# Patient Record
Sex: Male | Born: 1975 | Race: White | Hispanic: No | Marital: Married | Smoking: Never smoker
Health system: Southern US, Community
[De-identification: ages and names within clinical notes are randomized; demographics above are authoritative.]

## PROBLEM LIST (undated history)

## (undated) DIAGNOSIS — G43909 Migraine, unspecified, not intractable, without status migrainosus: Secondary | ICD-10-CM

## (undated) DIAGNOSIS — I10 Essential (primary) hypertension: Secondary | ICD-10-CM

## (undated) DIAGNOSIS — E039 Hypothyroidism, unspecified: Secondary | ICD-10-CM

## (undated) HISTORY — PX: NO PAST SURGERIES: SHX2092

---

## 2016-11-30 ENCOUNTER — Ambulatory Visit
Admission: EM | Admit: 2016-11-30 | Discharge: 2016-11-30 | Disposition: A | Discharge status: Home / Self Care | Payer: BLUE CROSS/BLUE SHIELD | Attending: Family Medicine | Admitting: Family Medicine

## 2016-11-30 ENCOUNTER — Encounter: Payer: Self-pay | Admitting: Emergency Medicine

## 2016-11-30 ENCOUNTER — Other Ambulatory Visit: Payer: Self-pay

## 2016-11-30 DIAGNOSIS — G43009 Migraine without aura, not intractable, without status migrainosus: Secondary | ICD-10-CM

## 2016-11-30 MED ORDER — HYDROCODONE-ACETAMINOPHEN 5-325 MG PO TABS: 1.0000 | ORAL_TABLET | Freq: Two times a day (BID) | ORAL | 0 refills | Status: DC | PRN

## 2016-11-30 MED ORDER — KETOROLAC TROMETHAMINE 60 MG/2ML IM SOLN
60.0000 mg | Freq: Once | INTRAMUSCULAR | Status: AC
  Administered 2016-11-30: 60 mg via INTRAMUSCULAR

## 2016-11-30 NOTE — ED Provider Notes (Signed)
[facility]    CSN: [id number] Arrival date & time: 11/30/16  1409     History   Chief Complaint Chief Complaint  Patient presents with  . Headache    HPI Roger KillJeffrey Eutsler is a 41 y.o. male.   41 yo male with a c/o headache for 5 days, mainly on the right side associated with photophobia. Denies any nausea, vomiting, vision changes, fevers, chills, numbness/tingling. States has had migraine headaches in the past.    The history is provided by the patient.  Headache    History reviewed. No pertinent past medical history.  There are no active problems to display for this patient.   History reviewed. No pertinent surgical history.     Home Medications    Prior to Admission medications   Medication Sig Start Date End Date Taking? Authorizing Provider  aspirin EC 81 MG tablet Take 81 mg daily by mouth.   Yes [provider]  HYDROcodone-acetaminophen (NORCO/VICODIN) 5-325 MG tablet Take 1-2 tablets 2 (two) times daily as needed by mouth. 11/30/16   Payton Mccallumonty, Leina Babe, MD    Family History Family History  Problem Relation Age of Onset  . Psoriasis Father [id number]       alcoholic psoriasis of liver  . Coronary artery disease Father     Social History Social History   Tobacco Use  . Smoking status: Never Smoker  . Smokeless tobacco: Never Used  Substance Use Topics  . Alcohol use: Yes    Alcohol/week: 4.8 oz    Types: 8 Cans of beer per week  . Drug use: No     Allergies   Patient has no known allergies.   Review of Systems Review of Systems  Neurological: Positive for headaches.     Physical Exam Triage Vital Signs ED Triage Vitals [11/30/16 1426]  Enc Vitals Group     BP (!) 127/98     Pulse Rate 100     Resp 16     Temp 98.6 F (37 C)     Temp Source Oral     SpO2 97 %     Weight 200 lb (90.7 kg)     Height 5\' 7"  (1.702 m)     Head Circumference      Peak Flow      Pain Score 6     Pain Loc      Pain Edu?      Excl. in  GC?    No data found.  Updated Vital Signs BP (!) 127/98 (BP Location: Left Arm)   Pulse 100   Temp 98.6 F (37 C) (Oral)   Resp 16   Ht 5\' 7"  (1.702 m)   Wt 200 lb (90.7 kg)   SpO2 97%   BMI 31.32 kg/m   Visual Acuity Right Eye Distance:   Left Eye Distance:   Bilateral Distance:    Right Eye Near:   Left Eye Near:    Bilateral Near:     Physical Exam  Constitutional: He is oriented to person, place, and time. He appears well-developed and well-nourished. No distress.  HENT:  Head: Normocephalic and atraumatic.  Right Ear: Tympanic membrane, external ear and ear canal normal.  Left Ear: Tympanic membrane, external ear and ear canal normal.  Nose: Nose normal.  Mouth/Throat: Uvula is midline, oropharynx is clear and moist and mucous membranes are normal. No oropharyngeal exudate or tonsillar abscesses.  Eyes: Conjunctivae and EOM are normal. Pupils are equal, round, and reactive  to light. Right eye exhibits no discharge. Left eye exhibits no discharge. No scleral icterus.  Neck: Normal range of motion. Neck supple. No tracheal deviation present. No thyromegaly present.  Cardiovascular: Normal rate, regular rhythm and normal heart sounds.  Pulmonary/Chest: Effort normal and breath sounds normal. No stridor. No respiratory distress. He has no wheezes. He has no rales. He exhibits no tenderness.  Lymphadenopathy:    He has no cervical adenopathy.  Neurological: He is alert and oriented to person, place, and time. He displays normal reflexes. No cranial nerve deficit or sensory deficit. He exhibits normal muscle tone. Coordination normal.  Skin: Skin is warm and dry. No rash noted. He is not diaphoretic.  Nursing note and vitals reviewed.    UC Treatments / Results  Labs (all labs ordered are listed, but only abnormal results are displayed) Labs Reviewed - No data to display  EKG  EKG Interpretation None       Radiology No results found.  Procedures Procedures  (including critical care time)  Medications Ordered in UC Medications  ketorolac (TORADOL) injection 60 mg (60 mg Intramuscular Given 11/30/16 1457)     Initial Impression / Assessment and Plan / UC Course  I have reviewed the triage vital signs and the nursing notes.  Pertinent labs & imaging results that were available during my care of the patient were reviewed by me and considered in my medical decision making (see chart for details).       Final Clinical Impressions(s) / UC Diagnoses   Final diagnoses:  Migraine without aura and without status migrainosus, not intractable    ED Discharge Orders        Ordered    HYDROcodone-acetaminophen (NORCO/VICODIN) 5-325 MG tablet  2 times daily PRN     11/30/16 1508    1. diagnosis reviewed with patient 2. rx as per orders above; reviewed possible side effects, interactions, risks and benefits  3. Given toradol 60mg  im x 1 with improvement 4. Follow up with PCP for recheck blood pressure 5. Follow-up prn if symptoms worsen or don't improve  Controlled Substance Prescriptions [place] Controlled Substance Registry consulted? Not Applicable   Payton Mccallumonty, Hazaiah Edgecombe, MD 11/30/16 2054

## 2016-11-30 NOTE — ED Triage Notes (Signed)
Patient in today c/o headache x 5 days. Tylenol is not helping. Patient states his BP has been elevated. Patient is not on BP meds.

## 2017-02-04 DIAGNOSIS — R519 Headache, unspecified: Secondary | ICD-10-CM | POA: Insufficient documentation

## 2017-02-04 DIAGNOSIS — E669 Obesity, unspecified: Secondary | ICD-10-CM | POA: Insufficient documentation

## 2017-02-04 DIAGNOSIS — I10 Essential (primary) hypertension: Secondary | ICD-10-CM | POA: Insufficient documentation

## 2019-03-10 DIAGNOSIS — E781 Pure hyperglyceridemia: Secondary | ICD-10-CM | POA: Insufficient documentation

## 2019-11-28 ENCOUNTER — Ambulatory Visit (INDEPENDENT_AMBULATORY_CARE_PROVIDER_SITE_OTHER): Payer: BC Managed Care – PPO

## 2019-11-28 ENCOUNTER — Encounter: Payer: Self-pay | Admitting: Emergency Medicine

## 2019-11-28 ENCOUNTER — Other Ambulatory Visit: Payer: Self-pay

## 2019-11-28 ENCOUNTER — Ambulatory Visit
Admission: EM | Admit: 2019-11-28 | Discharge: 2019-11-28 | Disposition: A | Discharge status: Home / Self Care | Payer: BC Managed Care – PPO | Attending: Internal Medicine | Admitting: Internal Medicine

## 2019-11-28 DIAGNOSIS — Z79899 Other long term (current) drug therapy: Secondary | ICD-10-CM | POA: Diagnosis not present

## 2019-11-28 DIAGNOSIS — Z7982 Long term (current) use of aspirin: Secondary | ICD-10-CM | POA: Diagnosis not present

## 2019-11-28 DIAGNOSIS — R0602 Shortness of breath: Secondary | ICD-10-CM | POA: Diagnosis not present

## 2019-11-28 DIAGNOSIS — J0120 Acute ethmoidal sinusitis, unspecified: Secondary | ICD-10-CM | POA: Diagnosis not present

## 2019-11-28 DIAGNOSIS — R509 Fever, unspecified: Secondary | ICD-10-CM

## 2019-11-28 DIAGNOSIS — R197 Diarrhea, unspecified: Secondary | ICD-10-CM | POA: Diagnosis not present

## 2019-11-28 DIAGNOSIS — G43801 Other migraine, not intractable, with status migrainosus: Secondary | ICD-10-CM | POA: Insufficient documentation

## 2019-11-28 DIAGNOSIS — M791 Myalgia, unspecified site: Secondary | ICD-10-CM | POA: Diagnosis present

## 2019-11-28 DIAGNOSIS — Z20822 Contact with and (suspected) exposure to covid-19: Secondary | ICD-10-CM | POA: Diagnosis not present

## 2019-11-28 HISTORY — DX: Migraine, unspecified, not intractable, without status migrainosus: G43.909

## 2019-11-28 HISTORY — DX: Essential (primary) hypertension: I10

## 2019-11-28 LAB — RAPID INFLUENZA A&B ANTIGENS
Influenza A (ARMC): NEGATIVE
Influenza B (ARMC): NEGATIVE

## 2019-11-28 LAB — CBC WITH DIFFERENTIAL/PLATELET
Abs Immature Granulocytes: 0.09 10*3/uL — ABNORMAL HIGH (ref 0.00–0.07)
Basophils Absolute: 0.1 10*3/uL (ref 0.0–0.1)
Basophils Relative: 0 %
Eosinophils Absolute: 0 10*3/uL (ref 0.0–0.5)
Eosinophils Relative: 0 %
HCT: 47.6 % (ref 39.0–52.0)
Hemoglobin: 16.8 g/dL (ref 13.0–17.0)
Immature Granulocytes: 1 %
Lymphocytes Relative: 6 %
Lymphs Abs: 0.8 10*3/uL (ref 0.7–4.0)
MCH: 30.5 pg (ref 26.0–34.0)
MCHC: 35.3 g/dL (ref 30.0–36.0)
MCV: 86.5 fL (ref 80.0–100.0)
Monocytes Absolute: 0.9 10*3/uL (ref 0.1–1.0)
Monocytes Relative: 7 %
Neutro Abs: 12.1 10*3/uL — ABNORMAL HIGH (ref 1.7–7.7)
Neutrophils Relative %: 86 %
Platelets: 182 10*3/uL (ref 150–400)
RBC: 5.5 MIL/uL (ref 4.22–5.81)
RDW: 11.9 % (ref 11.5–15.5)
WBC: 13.9 10*3/uL — ABNORMAL HIGH (ref 4.0–10.5)
nRBC: 0 % (ref 0.0–0.2)

## 2019-11-28 LAB — SARS CORONAVIRUS 2 (TAT 6-24 HRS): SARS Coronavirus 2: NEGATIVE

## 2019-11-28 LAB — SARS CORONAVIRUS 2 AG (30 MIN TAT): SARS Coronavirus 2 Ag: NEGATIVE

## 2019-11-28 MED ORDER — CEFDINIR 300 MG PO CAPS: 300.0000 mg | ORAL_CAPSULE | Freq: Two times a day (BID) | ORAL | 0 refills | Status: DC

## 2019-11-28 MED ORDER — FLUTICASONE PROPIONATE 50 MCG/ACT NA SUSP: 1.0000 | Freq: Every day | NASAL | 0 refills | Status: AC

## 2019-11-28 MED ORDER — KETOROLAC TROMETHAMINE 60 MG/2ML IM SOLN
60.0000 mg | Freq: Once | INTRAMUSCULAR | Status: AC
  Administered 2019-11-28: 60 mg via INTRAMUSCULAR

## 2019-11-28 NOTE — ED Provider Notes (Signed)
[facility]    CSN: [id number] Arrival date & time: 11/28/19  8921      History   Chief Complaint Chief Complaint  Patient presents with  . Generalized Body Aches    HPI Lola Lofaro is a 44 y.o. male who presents with onset of body aches, nose congestion, SOB, HA, and subjective fever x 2 days. Has also been having diarrhea 5 BM's per day x 2 days and night sweats. Denies being out of the country or an antibiotics in the past month. Denies a cough, just clearing his throat from the green post nasal drainage. Denies being sick at all in the past 3 weeks prior to this sickness. Has hx of migraine HA's and he feels he has one right now. Has taken Tylenol 2 nights ago and did not help the aches or HA, and took Nyquil last night and again did not help. He feels he is having acute migraine since the HA is right under his L orbit and has photophobia. Pain level 8/10.   Past Medical History:  Diagnosis Date  . Hypertension   . Migraine     There are no problems to display for this patient.   Past Surgical History:  Procedure Laterality Date  . NO PAST SURGERIES       Home Medications    Prior to Admission medications   Medication Sig Start Date End Date Taking? Authorizing Provider  aspirin EC 81 MG tablet Take 81 mg daily by mouth.   Yes [provider]  lisinopril (ZESTRIL) 10 MG tablet Take 10 mg by mouth daily.   Yes [provider]  SUMAtriptan (IMITREX) 50 MG tablet Take 50 mg by mouth every 2 (two) hours as needed for migraine. May repeat in 2 hours if headache persists or recurs.   Yes [provider]  cefdinir (OMNICEF) 300 MG capsule Take 1 capsule (300 mg total) by mouth 2 (two) times daily. 11/28/19   Rodriguez-Southworth, Nettie Elm, PA-C  fluticasone (FLONASE) 50 MCG/ACT nasal spray Place 1 spray into both nostrils daily. For stuffy nose 11/28/19   Rodriguez-Southworth, Nettie Elm, PA-C    Family History Family History  Problem  Relation Age of Onset  . Psoriasis Father 71       alcoholic psoriasis of liver  . Coronary artery disease Father     Social History Social History   Tobacco Use  . Smoking status: Never Smoker  . Smokeless tobacco: Never Used  Vaping Use  . Vaping Use: Never used  Substance Use Topics  . Alcohol use: Yes    Alcohol/week: 8.0 standard drinks    Types: 8 Cans of beer per week  . Drug use: No     Allergies   Patient has no known allergies.   Review of Systems Review of Systems  Constitutional: Positive for activity change, appetite change, chills, diaphoresis, fatigue and fever.  HENT: Positive for congestion, postnasal drip, rhinorrhea and sinus pressure. Negative for ear discharge, ear pain, sinus pain, sore throat and trouble swallowing.   Eyes: Positive for photophobia. Negative for discharge.  Respiratory: Positive for shortness of breath. Negative for cough and wheezing.   Cardiovascular: Negative for chest pain.  Gastrointestinal: Negative for abdominal pain, diarrhea, nausea and vomiting.  Genitourinary: Negative for difficulty urinating, dysuria and frequency.  Musculoskeletal: Positive for myalgias. Negative for gait problem.  Skin: Negative for rash.  Neurological: Positive for headaches. Negative for dizziness and weakness.   Physical Exam Triage Vital Signs ED Triage  Vitals  Enc Vitals Group     BP 11/28/19 0906 (!) 149/105     Pulse Rate 11/28/19 0906 (!) 122     Resp 11/28/19 0906 18     Temp 11/28/19 0906 (!) 100.6 F (38.1 C)     Temp Source 11/28/19 0906 Oral     SpO2 11/28/19 0906 96 %     Weight 11/28/19 0903 199 lb 15.3 oz (90.7 kg)     Height 11/28/19 0903 5\' 7"  (1.702 m)     Head Circumference --      Peak Flow --      Pain Score 11/28/19 0903 8     Pain Loc --      Pain Edu? --      Excl. in GC? --    No data found.  Updated Vital Signs BP (!) 134/109   Pulse (!) 110   Temp 99.4 F (37.4 C) (Oral)   Resp 18   Ht 5\' 7"  (1.702 m)    Wt 199 lb 15.3 oz (90.7 kg)   SpO2 96%   BMI 31.32 kg/m   Visual Acuity Right Eye Distance:   Left Eye Distance:   Bilateral Distance:    Right Eye Near:   Left Eye Near:    Bilateral Near:     Physical Exam Vitals signs and nursing note reviewed.  Constitutional:      General: She is not in acute distress.    Appearance: Normal appearance. She is not ill-appearing, toxic-appearing or diaphoretic.  HENT:     Head: Normocephalic.     Right Ear: Tympanic membrane, ear canal and external ear normal.     Left Ear: Tympanic membrane, ear canal and external ear normal.     Nose: with mild mucosa congestion. His ethmoid sinuses are tender L>R    Mouth/Throat: clear    Mouth: Mucous membranes are moist.  Eyes:     General: No scleral icterus.       Right eye: No discharge.        Left eye: No discharge.     Conjunctiva/sclera: Conjunctivae normal.  Neck:     Musculoskeletal: Neck supple. No neck rigidity.  Cardiovascular:     Rate and Rhythm: Normal rate and regular rhythm.     Heart sounds: No murmur.  Pulmonary:     Effort: Pulmonary effort is normal.     Breath sounds: Normal breath sounds.  Abdominal:     General: Bowel sounds are normal. There is no distension.     Palpations: Abdomen is soft. There is no mass.     Tenderness: There is no abdominal tenderness. There is no guarding or rebound.     Hernia: No hernia is present.  Musculoskeletal: Normal range of motion.  Lymphadenopathy:     Cervical: No cervical adenopathy.  Skin:    General: Skin is warm and dry.     Coloration: Skin is not jaundiced.     Findings: No rash.  Neurological:     Mental Status: he is alert and oriented to person, place, and time. No face asymmetry noted.     Gait: Gait normal. +2/3 upper and lower extremity reflexes. Normal Romberg, tandem gait, finger to nose, heel and tip toe gait. Strength of upper and lower extremities 5/5 and symmetric.  Psychiatric:        Mood and Affect: Mood  normal.        Behavior: Behavior normal.  Thought Content: Thought content normal.        Judgment: Judgment normal.     UC Treatments / Results  Labs (all labs ordered are listed, but only abnormal results are displayed) Labs Reviewed  CBC WITH DIFFERENTIAL/PLATELET - Abnormal; Notable for the following components:      Result Value   WBC 13.9 (*)    Neutro Abs 12.1 (*)    Abs Immature Granulocytes 0.09 (*)    All other components within normal limits  RAPID INFLUENZA A&B ANTIGENS  SARS CORONAVIRUS 2 AG (30 MIN TAT)  SARS CORONAVIRUS 2 (TAT 6-24 HRS)    EKG   Radiology DG Chest 2 View  Result Date: 11/28/2019 CLINICAL DATA:  Shortness of breath, fever. EXAM: CHEST - 2 VIEW COMPARISON:  None. FINDINGS: The heart size and mediastinal contours are within normal limits. Both lungs are clear. No pneumothorax or pleural effusion is noted. The visualized skeletal structures are unremarkable. IMPRESSION: No active cardiopulmonary disease. Electronically Signed   By: Lupita Raider M.D.   On: 11/28/2019 09:33    Procedures Procedures (including critical care time)  Medications Ordered in UC Medications  ketorolac (TORADOL) injection 60 mg (60 mg Intramuscular Given 11/28/19 0948)    Initial Impression / Assessment and Plan / UC Course  I have reviewed the triage vital signs and the nursing notes. He was given Toradol 600 mg IM and I checked on him 30 minutes later and his HA was better, at 1/10.  Pertinent labs & imaging results that were available during my care of the patient were reviewed by me and considered in my medical decision making (see chart for details). His flu and rapid covid test are neg. The CXR was also negative. His WBC was 13 k with elevation of neutrophils, so has a bacterial respiratory infection which more likely is from his sinuses. I placed him on Cefdinir as noted. See instructions.    Final Clinical Impressions(s) / UC Diagnoses   Final  diagnoses:  Acute non-recurrent ethmoidal sinusitis  Diarrhea, unspecified type     Discharge Instructions     Your cell count shows you have a bacterial infection and I suspect is from your sinuses. The rest of the tests are negative.  Watch this video who to do saline nose rinses.  [email or web address]   Do not use tap water, only boiled water that has been cooled down.  Do it twice a day  for 5-7 days, but avoid bed time.   Avoid dairy until the diarrhea is gone. Stay on bland, carb and starchy diet while having the diarrhea and make sure to eat a banana a day to replenish your potasium and sports drinks.     ED Prescriptions    Medication Sig Dispense Auth. Provider   fluticasone (FLONASE) 50 MCG/ACT nasal spray Place 1 spray into both nostrils daily. For stuffy nose 18.2 g Rodriguez-Southworth, Nettie Elm, PA-C   cefdinir (OMNICEF) 300 MG capsule Take 1 capsule (300 mg total) by mouth 2 (two) times daily. 14 capsule Rodriguez-Southworth, Nettie Elm, PA-C     PDMP not reviewed this encounter.   Garey Ham, PA-C 11/28/19 1453

## 2019-11-28 NOTE — ED Triage Notes (Signed)
Pt c/o body aches, insomnia, shortness of breath, migraine, nasal congestion, subjective fever. Started about 2 days ago.

## 2019-11-28 NOTE — Discharge Instructions (Addendum)
Your cell count shows you have a bacterial infection and I suspect is from your sinuses. The rest of the tests are negative.  Watch this video who to do saline nose rinses.  [email or web address]   Do not use tap water, only boiled water that has been cooled down.  Do it twice a day  for 5-7 days, but avoid bed time.   Avoid dairy until the diarrhea is gone. Stay on bland, carb and starchy diet while having the diarrhea and make sure to eat a banana a day to replenish your potasium and sports drinks.

## 2020-12-11 ENCOUNTER — Encounter: Payer: Self-pay | Admitting: Gastroenterology

## 2020-12-11 ENCOUNTER — Other Ambulatory Visit: Payer: Self-pay

## 2020-12-11 ENCOUNTER — Ambulatory Visit (INDEPENDENT_AMBULATORY_CARE_PROVIDER_SITE_OTHER): Payer: BC Managed Care – PPO | Admitting: Gastroenterology

## 2020-12-11 VITALS — BP 127/86 | HR 90 | Temp 98.6°F | Ht 70.0 in | Wt 198.6 lb

## 2020-12-11 DIAGNOSIS — F1090 Alcohol use, unspecified, uncomplicated: Secondary | ICD-10-CM | POA: Diagnosis not present

## 2020-12-11 DIAGNOSIS — K625 Hemorrhage of anus and rectum: Secondary | ICD-10-CM

## 2020-12-11 DIAGNOSIS — R1032 Left lower quadrant pain: Secondary | ICD-10-CM

## 2020-12-11 MED ORDER — CLENPIQ 10-3.5-12 MG-GM -GM/160ML PO SOLN: 1.0000 | ORAL | 0 refills | Status: DC

## 2020-12-11 NOTE — Progress Notes (Signed)
Michael Repress, MD [address]  [address]  [place], [place] [address]  Main: [phone]  Fax: [phone]    Gastroenterology Consultation  Referring Provider:     Loreli Slot, PA-C Primary Care Physician:  Patient, No Pcp Per (Inactive) Primary Gastroenterologist:  Dr. Arlyss Wagner Reason for Consultation:   Rectal bleeding, left lower quadrant pain        HPI:   Michael Wagner is a 45 y.o. male referred by Dr. Patient, No Pcp Per (Inactive)  for consultation & management of rectal bleeding, left lower quadrant pain.  Patient reports that about a month ago, he had painless bright red blood per rectum which occurred twice and resolved on its own.  Patient reports that he has been experiencing left lower quadrant abdominal pain, subjective fevers and his stools were mushy.  He reports having a formed bowel movement today and he is pain is improving.  He denies any history of constipation or diarrhea.  He denies any further episodes of rectal bleeding.  He does acknowledge drinking alcohol, mostly beer 3-4 daily for last 10 years.  His wife noticed that his eyes were yellow last week.  Patient does not smoke He works for [employer] He denies any abdominal surgery  NSAIDs: None  Antiplts/Anticoagulants/Anti thrombotics: None  GI Procedures: None he denies any family history of GI malignancy or liver disease  Past Medical History:  Diagnosis Date   Hypertension    Migraine     Past Surgical History:  Procedure Laterality Date   NO PAST SURGERIES      Current Outpatient Medications:    fluticasone (FLONASE) 50 MCG/ACT nasal spray, Place 1 spray into both nostrils daily. For stuffy nose, Disp: 18.2 g, Rfl: 0   lisinopril (ZESTRIL) 10 MG tablet, Take 10 mg by mouth daily., Disp: , Rfl:    Sod Picosulfate-Mag Ox-Cit Acd (CLENPIQ) 10-3.5-12 MG-GM -GM/160ML SOLN, Take 1 kit by mouth as directed. At 5 PM evening before procedure, drink 1 bottle of Clenpiq, hydrate,  drink (5) 8 oz of water. Then do the same thing 5 hours prior to your procedure., Disp: 320 mL, Rfl: 0   SUMAtriptan (IMITREX) 50 MG tablet, Take 50 mg by mouth every 2 (two) hours as needed for migraine. May repeat in 2 hours if headache persists or recurs., Disp: , Rfl:     Family History  Problem Relation Age of Onset   Psoriasis Father 34       alcoholic psoriasis of liver   Coronary artery disease Father      Social History   Tobacco Use   Smoking status: Never   Smokeless tobacco: Never  Vaping Use   Vaping Use: Never used  Substance Use Topics   Alcohol use: Yes    Alcohol/week: 8.0 standard drinks    Types: 8 Cans of beer per week   Drug use: No    Allergies as of 12/11/2020   (No Known Allergies)    Review of Systems:    All systems reviewed and negative except where noted in HPI.   Physical Exam:  BP 127/86 (BP Location: Left Arm, Patient Position: Sitting, Cuff Size: Large)   Pulse 90   Temp 98.6 F (37 C) (Oral)   Ht 5\' 10"  (1.778 m)   Wt 198 lb 9.6 oz (90.1 kg)   BMI 28.50 kg/m  No LMP for male patient.  General:   Alert,  Well-developed, well-nourished, pleasant and cooperative in NAD  Head:  Normocephalic and atraumatic. Eyes:  Sclera clear, no icterus.   Conjunctiva pink. Ears:  Normal auditory acuity. Nose:  No deformity, discharge, or lesions. Mouth:  No deformity or lesions,oropharynx pink & moist. Neck:  Supple; no masses or thyromegaly. Lungs:  Respirations even and unlabored.  Clear throughout to auscultation.   No wheezes, crackles, or rhonchi. No acute distress. Heart:  Regular rate and rhythm; no murmurs, clicks, rubs, or gallops. Abdomen:  Normal bowel sounds. Soft, left lower quadrant tenderness and non-distended without masses, hepatosplenomegaly or hernias noted.  No guarding or rebound tenderness.   Rectal: Not performed Msk:  Symmetrical without gross deformities. Good, equal movement & strength bilaterally. Pulses:  Normal pulses  noted. Extremities:  No clubbing or edema.  No cyanosis. Neurologic:  Alert and oriented x3;  grossly normal neurologically. Skin:  Intact without significant lesions or rashes. No jaundice. Psych:  Alert and cooperative. Normal mood and affect.  Imaging Studies: None  Assessment and Plan:   Michael Wagner is a 45 y.o. pleasant Caucasian male with history of hypertension, chronic alcohol use is seen in consultation for 1 day history of left lower quadrant pain and an episode of bright red blood per rectum  Left lower quadrant pain and tenderness: Pain is improving He probably has mild attack of acute uncomplicated diverticulitis Check CBC today if he has significant leukocytosis, will try antibiotics, if pain is persistent or worsening, recommend CT abdomen and pelvis with contrast Advised patient to stay on liquid diet for next 24 to 48 hours Avoid consumption of alcohol  Rectal bleeding Given his age, recommend diagnostic colonoscopy and patient is agreeable  I have discussed alternative options, risks & benefits,  which include, but are not limited to, bleeding, infection, perforation,respiratory complication & drug reaction.  The patient agrees with this plan & written consent will be obtained.    Chronic alcohol use Check LFTs   Follow up after above work up   Michael Darby, MD

## 2020-12-11 NOTE — Addendum Note (Signed)
Addended by: Linward Foster on: 12/11/2020 10:02 AM   Modules accepted: Orders

## 2020-12-12 LAB — COMPREHENSIVE METABOLIC PANEL
ALT: 26 IU/L (ref 0–44)
AST: 19 IU/L (ref 0–40)
Albumin/Globulin Ratio: 1.7 (ref 1.2–2.2)
Albumin: 4.5 g/dL (ref 4.0–5.0)
Alkaline Phosphatase: 60 IU/L (ref 44–121)
BUN/Creatinine Ratio: 14 (ref 9–20)
BUN: 17 mg/dL (ref 6–24)
Bilirubin Total: 0.8 mg/dL (ref 0.0–1.2)
CO2: 23 mmol/L (ref 20–29)
Calcium: 9.3 mg/dL (ref 8.7–10.2)
Chloride: 100 mmol/L (ref 96–106)
Creatinine, Ser: 1.2 mg/dL (ref 0.76–1.27)
Globulin, Total: 2.6 g/dL (ref 1.5–4.5)
Glucose: 98 mg/dL (ref 70–99)
Potassium: 4.6 mmol/L (ref 3.5–5.2)
Sodium: 141 mmol/L (ref 134–144)
Total Protein: 7.1 g/dL (ref 6.0–8.5)
eGFR: 75 mL/min/{1.73_m2} (ref >59)

## 2020-12-12 LAB — CBC
Hematocrit: 46.2 % (ref 37.5–51.0)
Hemoglobin: 15.8 g/dL (ref 13.0–17.7)
MCH: 30.7 pg (ref 26.6–33.0)
MCHC: 34.2 g/dL (ref 31.5–35.7)
MCV: 90 fL (ref 79–97)
Platelets: 202 10*3/uL (ref 150–450)
RBC: 5.14 x10E6/uL (ref 4.14–5.80)
RDW: 11.8 % (ref 11.6–15.4)
WBC: 10.7 10*3/uL (ref 3.4–10.8)

## 2020-12-13 ENCOUNTER — Encounter: Payer: Self-pay | Admitting: Gastroenterology

## 2020-12-14 ENCOUNTER — Telehealth: Payer: Self-pay

## 2020-12-14 ENCOUNTER — Other Ambulatory Visit: Payer: Self-pay

## 2020-12-14 ENCOUNTER — Ambulatory Visit
Admission: RE | Admit: 2020-12-14 | Discharge: 2020-12-14 | Disposition: A | Discharge status: Home / Self Care | Payer: BC Managed Care – PPO | Source: Ambulatory Visit | Attending: Gastroenterology | Admitting: Gastroenterology

## 2020-12-14 ENCOUNTER — Other Ambulatory Visit: Payer: BC Managed Care – PPO

## 2020-12-14 DIAGNOSIS — R109 Unspecified abdominal pain: Secondary | ICD-10-CM | POA: Insufficient documentation

## 2020-12-14 DIAGNOSIS — R197 Diarrhea, unspecified: Secondary | ICD-10-CM

## 2020-12-14 DIAGNOSIS — R634 Abnormal weight loss: Secondary | ICD-10-CM | POA: Insufficient documentation

## 2020-12-14 MED ORDER — METRONIDAZOLE 500 MG PO TABS: 500.0000 mg | ORAL_TABLET | Freq: Two times a day (BID) | ORAL | 0 refills | Status: AC

## 2020-12-14 MED ORDER — CIPROFLOXACIN HCL 500 MG PO TABS: 500.0000 mg | ORAL_TABLET | Freq: Two times a day (BID) | ORAL | 0 refills | Status: DC

## 2020-12-14 MED ORDER — IOHEXOL 300 MG/ML  SOLN
100.0000 mL | Freq: Once | INTRAMUSCULAR | Status: AC | PRN
  Administered 2020-12-14: 100 mL via INTRAVENOUS

## 2020-12-14 NOTE — Progress Notes (Signed)
Deleted order put in wrong spot

## 2020-12-14 NOTE — Telephone Encounter (Signed)
Called patient he is now on his way to hospital for ct scan and will stop by office for supplies to do other lab oredered today

## 2020-12-14 NOTE — Telephone Encounter (Signed)
Ginger working on appointment for ct scan I put order in and also put in pcr profile test in as well will call patient when we have approval and time for c t scan

## 2020-12-14 NOTE — Progress Notes (Signed)
Sent in refill for antbiotics for 7 day course as requested by Dr Allegra Lai for his diverticulitis called patient spoke with wife she understands his results and his medicine

## 2020-12-18 LAB — GI PROFILE, STOOL, PCR

## 2021-01-05 ENCOUNTER — Encounter: Payer: Self-pay | Admitting: Gastroenterology

## 2021-01-06 ENCOUNTER — Ambulatory Visit: Payer: BC Managed Care – PPO | Admitting: Certified Registered Nurse Anesthetist

## 2021-01-06 ENCOUNTER — Ambulatory Visit
Admission: RE | Admit: 2021-01-06 | Discharge: 2021-01-06 | Disposition: A | Discharge status: Home / Self Care | Payer: BC Managed Care – PPO | Attending: Gastroenterology | Admitting: Gastroenterology

## 2021-01-06 ENCOUNTER — Encounter
Admission: RE | Disposition: A | Discharge status: Home / Self Care | Payer: Self-pay | Source: Home / Self Care | Attending: Gastroenterology

## 2021-01-06 DIAGNOSIS — E039 Hypothyroidism, unspecified: Secondary | ICD-10-CM | POA: Diagnosis not present

## 2021-01-06 DIAGNOSIS — K625 Hemorrhage of anus and rectum: Secondary | ICD-10-CM | POA: Diagnosis not present

## 2021-01-06 DIAGNOSIS — I10 Essential (primary) hypertension: Secondary | ICD-10-CM | POA: Diagnosis not present

## 2021-01-06 DIAGNOSIS — R1032 Left lower quadrant pain: Secondary | ICD-10-CM | POA: Diagnosis not present

## 2021-01-06 DIAGNOSIS — F1090 Alcohol use, unspecified, uncomplicated: Secondary | ICD-10-CM

## 2021-01-06 DIAGNOSIS — G43909 Migraine, unspecified, not intractable, without status migrainosus: Secondary | ICD-10-CM | POA: Diagnosis not present

## 2021-01-06 DIAGNOSIS — K5730 Diverticulosis of large intestine without perforation or abscess without bleeding: Secondary | ICD-10-CM | POA: Diagnosis not present

## 2021-01-06 HISTORY — PX: COLONOSCOPY WITH PROPOFOL: SHX5780

## 2021-01-06 HISTORY — DX: Hypothyroidism, unspecified: E03.9

## 2021-01-06 SURGERY — COLONOSCOPY WITH PROPOFOL
Anesthesia: General

## 2021-01-06 MED ORDER — PROPOFOL 10 MG/ML IV BOLUS
INTRAVENOUS | Status: DC | PRN
  Administered 2021-01-06: 80 mg via INTRAVENOUS

## 2021-01-06 MED ORDER — SODIUM CHLORIDE 0.9 % IV SOLN
INTRAVENOUS | Status: DC
  Administered 2021-01-06: 10:00:00 20 mL/h via INTRAVENOUS

## 2021-01-06 MED ORDER — PROPOFOL 500 MG/50ML IV EMUL
INTRAVENOUS | Status: DC | PRN
  Administered 2021-01-06: 175 ug/kg/min via INTRAVENOUS

## 2021-01-06 MED ORDER — PROPOFOL 500 MG/50ML IV EMUL
INTRAVENOUS | Status: AC
  Filled 2021-01-06: qty 50

## 2021-01-06 MED ORDER — LIDOCAINE HCL (CARDIAC) PF 100 MG/5ML IV SOSY
PREFILLED_SYRINGE | INTRAVENOUS | Status: DC | PRN
  Administered 2021-01-06: 50 mg via INTRAVENOUS

## 2021-01-06 MED ORDER — LIDOCAINE HCL (PF) 2 % IJ SOLN
INTRAMUSCULAR | Status: AC
  Filled 2021-01-06: qty 5

## 2021-01-06 NOTE — H&P (Signed)
Cephas Darby, MD [address]  [place]  [place], [place] [address]  Main: [phone]  Fax: [phone] Pager: [phone]  Primary Care Physician:  Becky Augusta, PA-C Primary Gastroenterologist:  Dr. Cephas Darby  Pre-Procedure History & Physical: HPI:  Michael Wagner is a 45 y.o. male is here for an colonoscopy.   Past Medical History:  Diagnosis Date   Hypertension    Hypothyroidism    Migraine     Past Surgical History:  Procedure Laterality Date   NO PAST SURGERIES      Prior to Admission medications   Medication Sig Start Date End Date Taking? Authorizing Provider  ciprofloxacin (CIPRO) 500 MG tablet Take 1 tablet (500 mg total) by mouth 2 (two) times daily after a meal. 12/14/20   Aloha Bartok, Tally Due, MD  fluticasone (FLONASE) 50 MCG/ACT nasal spray Place 1 spray into both nostrils daily. For stuffy nose 11/28/19   Rodriguez-Southworth, Sunday Spillers, PA-C  lisinopril (ZESTRIL) 10 MG tablet Take 10 mg by mouth daily.    [provider]  Sod Picosulfate-Mag Ox-Cit Acd (CLENPIQ) 10-3.5-12 MG-GM -GM/160ML SOLN Take 1 kit by mouth as directed. At 5 PM evening before procedure, drink 1 bottle of Clenpiq, hydrate, drink (5) 8 oz of water. Then do the same thing 5 hours prior to your procedure. 12/11/20   Lin Landsman, MD  SUMAtriptan (IMITREX) 50 MG tablet Take 50 mg by mouth every 2 (two) hours as needed for migraine. May repeat in 2 hours if headache persists or recurs.    [provider]    Allergies as of 12/11/2020   (No Known Allergies)    Family History  Problem Relation Age of Onset   Psoriasis Father 75       alcoholic psoriasis of liver   Coronary artery disease Father     Social History   Socioeconomic History   Marital status: Married    Spouse name: Not on file   Number of children: Not on file   Years of education: Not on file   Highest education level: Not on file  Occupational History   Not on file  Tobacco  Use   Smoking status: Never   Smokeless tobacco: Never  Vaping Use   Vaping Use: Never used  Substance and Sexual Activity   Alcohol use: Yes    Alcohol/week: 8.0 standard drinks    Types: 8 Cans of beer per week   Drug use: No   Sexual activity: Not on file  Other Topics Concern   Not on file  Social History Narrative   Not on file   Social Determinants of Health   Financial Resource Strain: Not on file  Food Insecurity: Not on file  Transportation Needs: Not on file  Physical Activity: Not on file  Stress: Not on file  Social Connections: Not on file  Intimate Partner Violence: Not on file    Review of Systems: See HPI, otherwise negative ROS  Physical Exam: BP (!) 133/94    Pulse 89    Temp (!) 96.8 F (36 C) (Temporal)    Resp 20    Ht _0  (1.676 m)    Wt 90.7 kg    SpO2 98%    BMI 32.28 kg/m  General:   Alert,  pleasant and cooperative in NAD Head:  Normocephalic and atraumatic. Neck:  Supple; no masses or thyromegaly. Lungs:  Clear throughout to auscultation.    Heart:  Regular rate and rhythm. Abdomen:  Soft, nontender and nondistended. Normal bowel sounds, without guarding, and without rebound.   Neurologic:  Alert and  oriented x4;  grossly normal neurologically.  Impression/Plan: Michael Wagner is here for an colonoscopy to be performed for rectal bleeding  Risks, benefits, limitations, and alternatives regarding  colonoscopy have been reviewed with the patient.  Questions have been answered.  All parties agreeable.   Sherri Sear, MD  01/06/2021, 10:15 AM

## 2021-01-06 NOTE — Anesthesia Postprocedure Evaluation (Signed)
Anesthesia Post Note  Patient: Michael Wagner  Procedure(s) Performed: COLONOSCOPY WITH PROPOFOL  Patient location during evaluation: Endoscopy Anesthesia Type: General Level of consciousness: awake and alert Pain management: pain level controlled Vital Signs Assessment: post-procedure vital signs reviewed and stable Respiratory status: spontaneous breathing, nonlabored ventilation, respiratory function stable and patient connected to nasal cannula oxygen Cardiovascular status: blood pressure returned to baseline and stable Postop Assessment: no apparent nausea or vomiting Anesthetic complications: no   No notable events documented.   Last Vitals:  Vitals:   01/06/21 1050 01/06/21 1056  BP: 111/77 111/77  Pulse: 79 (!) 47  Resp: (!) 22 (!) 24  Temp: (!) 36 C   SpO2: 98% 97%    Last Pain:  Vitals:   01/06/21 1050  TempSrc: Temporal  PainSc:                  Corinda Gubler

## 2021-01-06 NOTE — Anesthesia Preprocedure Evaluation (Signed)
Anesthesia Evaluation  Patient identified by MRN, date of birth, ID band Patient awake    Reviewed: Allergy & Precautions, NPO status , Patient's Chart, lab work & pertinent test results  History of Anesthesia Complications Negative for: history of anesthetic complications  Airway Mallampati: III  TM Distance: >3 FB Neck ROM: Full   Comment: Large beard Dental no notable dental hx. (+) Teeth Intact   Pulmonary neg pulmonary ROS, neg sleep apnea, neg COPD, Patient abstained from smoking.Not current smoker,    Pulmonary exam normal breath sounds clear to auscultation       Cardiovascular Exercise Tolerance: Good METShypertension, Pt. on medications (-) CAD and (-) Past MI (-) dysrhythmias  Rhythm:Regular Rate:Normal - Systolic murmurs    Neuro/Psych  Headaches, negative psych ROS   GI/Hepatic neg GERD  ,(+)     substance abuse  alcohol use, 4 beers per day   Endo/Other  neg diabetesHypothyroidism   Renal/GU negative Renal ROS     Musculoskeletal   Abdominal   Peds  Hematology   Anesthesia Other Findings Past Medical History: No date: Hypertension No date: Hypothyroidism No date: Migraine  Reproductive/Obstetrics                             Anesthesia Physical Anesthesia Plan  ASA: 2  Anesthesia Plan: General   Post-op Pain Management: Minimal or no pain anticipated   Induction: Intravenous  PONV Risk Score and Plan: 2 and Propofol infusion, TIVA and Ondansetron  Airway Management Planned: Nasal Cannula  Additional Equipment: None  Intra-op Plan:   Post-operative Plan:   Informed Consent: I have reviewed the patients History and Physical, chart, labs and discussed the procedure including the risks, benefits and alternatives for the proposed anesthesia with the patient or authorized representative who has indicated his/her understanding and acceptance.     Dental  advisory given  Plan Discussed with: CRNA and Surgeon  Anesthesia Plan Comments: (Discussed risks of anesthesia with patient, including possibility of difficulty with spontaneous ventilation under anesthesia necessitating airway intervention, PONV, and rare risks such as cardiac or respiratory or neurological events, and allergic reactions. Discussed the role of CRNA in patient's perioperative care. Patient understands.)        Anesthesia Quick Evaluation

## 2021-01-06 NOTE — Transfer of Care (Signed)
Immediate Anesthesia Transfer of Care Note  Patient: Michael Wagner  Procedure(s) Performed: COLONOSCOPY WITH PROPOFOL  Patient Location: PACU  Anesthesia Type:General  Level of Consciousness: awake, alert  and oriented  Airway & Oxygen Therapy: Patient Spontanous Breathing  Post-op Assessment: Report given to RN and Post -op Vital signs reviewed and stable  Post vital signs: Reviewed and stable  Last Vitals:  Vitals Value Taken Time  BP 111/77 01/06/21 1056  Temp    Pulse 47 01/06/21 1056  Resp 24 01/06/21 1056  SpO2 97 % 01/06/21 1056    Last Pain:  Vitals:   01/06/21 0948  TempSrc: Temporal  PainSc: 0-No pain         Complications: No notable events documented.

## 2021-01-06 NOTE — Anesthesia Procedure Notes (Signed)
Date/Time: 01/06/2021 10:30 AM Performed by: Ginger Carne, CRNA Pre-anesthesia Checklist: Patient identified, Emergency Drugs available, Suction available, Patient being monitored and Timeout performed Patient Re-evaluated:Patient Re-evaluated prior to induction Oxygen Delivery Method: Nasal cannula Preoxygenation: Pre-oxygenation with 100% oxygen Induction Type: IV induction

## 2021-01-06 NOTE — Op Note (Signed)
[facility] Gastroenterology Patient Name: Michael Wagner Procedure Date: 01/06/2021 10:18 AM MRN: [id number] Account #: [id number] Date of Birth: November 14, 1975 Admit Type: Outpatient Age: 45 Room: [facility] ENDO ROOM 4 Gender: Male Note Status: Finalized Instrument Name: Peds Colonoscope [id number] Procedure:             Colonoscopy Indications:           This is the patient's first colonoscopy, Abdominal                         pain in the left lower quadrant, Rectal bleeding Providers:             Toney Reil MD, MD Referring MD:          Loreli Slot (Referring MD) Medicines:             General Anesthesia Complications:         No immediate complications. Estimated blood loss: None. Procedure:             Pre-Anesthesia Assessment:                        - Prior to the procedure, a History and Physical was                         performed, and patient medications and allergies were                         reviewed. The patient is competent. The risks and                         benefits of the procedure and the sedation options and                         risks were discussed with the patient. All questions                         were answered and informed consent was obtained.                         Patient identification and proposed procedure were                         verified by the physician, the nurse, the                         anesthesiologist, the anesthetist and the technician                         in the pre-procedure area in the procedure room in the                         endoscopy suite. Mental Status Examination: alert and                         oriented. Airway Examination: normal oropharyngeal                         airway and neck mobility. Respiratory Examination:  clear to auscultation. CV Examination: normal.                         Prophylactic Antibiotics: The patient does not require                          prophylactic antibiotics. Prior Anticoagulants: The                         patient has taken no previous anticoagulant or                         antiplatelet agents. ASA Grade Assessment: II - A                         patient with mild systemic disease. After reviewing                         the risks and benefits, the patient was deemed in                         satisfactory condition to undergo the procedure. The                         anesthesia plan was to use general anesthesia.                         Immediately prior to administration of medications,                         the patient was re-assessed for adequacy to receive                         sedatives. The heart rate, respiratory rate, oxygen                         saturations, blood pressure, adequacy of pulmonary                         ventilation, and response to care were monitored                         throughout the procedure. The physical status of the                         patient was re-assessed after the procedure.                        After obtaining informed consent, the colonoscope was                         passed under direct vision. Throughout the procedure,                         the patient's blood pressure, pulse, and oxygen                         saturations were monitored continuously. The  Colonoscope was introduced through the anus and                         advanced to the the cecum, identified by appendiceal                         orifice and ileocecal valve. The colonoscopy was                         performed without difficulty. The patient tolerated                         the procedure well. The quality of the bowel                         preparation was evaluated using the BBPS [facility] Bowel                         Preparation Scale) with scores of: Right Colon = 3,                         Transverse Colon = 3 and Left Colon = 3 (entire mucosa                          seen well with no residual staining, small fragments                         of stool or opaque liquid). The total BBPS score                         equals 9. Findings:      The perianal and digital rectal examinations were normal. Pertinent       negatives include normal sphincter tone and no palpable rectal lesions.      Multiple small and large-mouthed diverticula were found in the sigmoid       colon. There was no evidence of diverticular bleeding.      The exam was otherwise without abnormality.      The retroflexed view of the distal rectum and anal verge was normal and       showed no anal or rectal abnormalities. Impression:            - Severe diverticulosis in the sigmoid colon. There                         was no evidence of diverticular bleeding.                        - The examination was otherwise normal.                        - The distal rectum and anal verge are normal on                         retroflexion view.                        - No specimens collected. Recommendation:        - Discharge  patient to home (with escort).                        - Resume previous diet today.                        - Continue present medications.                        - Repeat colonoscopy in 10 years for screening                         purposes. Procedure Code(s):     --- Professional ---                        [phone], Colonoscopy, flexible; diagnostic, including                         collection of specimen(s) by brushing or washing, when                         performed (separate procedure) Diagnosis Code(s):     --- Professional ---                        R10.32, Left lower quadrant pain                        K62.5, Hemorrhage of anus and rectum                        K57.30, Diverticulosis of large intestine without                         perforation or abscess without bleeding CPT copyright 2019 [employer]. All rights  reserved. The codes documented in this report are preliminary and upon coder review may  be revised to meet current compliance requirements. Dr. Libby Maw Toney Reil MD, MD 01/06/2021 10:42:43 AM This report has been signed electronically. Number of Addenda: 0 Note Initiated On: 01/06/2021 10:18 AM Scope Withdrawal Time: 0 hours 5 minutes 42 seconds  Total Procedure Duration: 0 hours 7 minutes 6 seconds  Estimated Blood Loss:  Estimated blood loss: none.      [facility]

## 2021-01-07 ENCOUNTER — Encounter: Payer: Self-pay | Admitting: Gastroenterology

## 2022-12-15 IMAGING — CT CT ABD-PELV W/ CM
2 of 5 series · 16 of 46 positions shown, 18 images · IV contrast (APPLIED)
Comparison: None.

CLINICAL DATA: Left lower quadrant pain. Rectal bleeding. Diarrhea.
Weight loss.

EXAM:
CT ABDOMEN AND PELVIS WITH CONTRAST
TECHNIQUE: Multidetector CT imaging of the abdomen and pelvis was performed
using the standard protocol following bolus administration of
intravenous contrast.
CONTRAST:  100mL OMNIPAQUE IOHEXOL 300 MG/ML  SOLN

[Series 2: axial st · axial · 0.78mm/px · z∈[-1017,-532]mm · 13 of 109 slices shown, 15 images]
[im 6/109  soft-tissue]
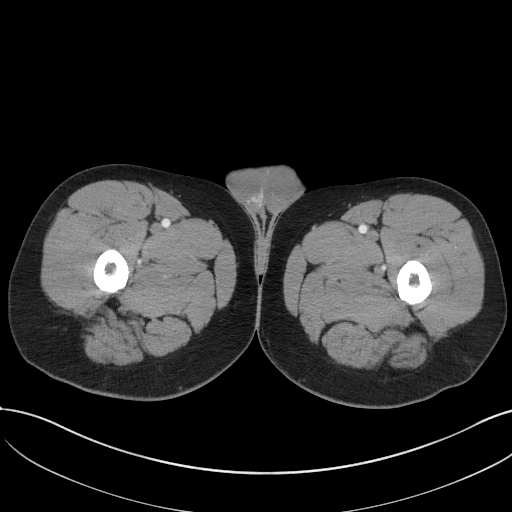
[im 6/109  bone]
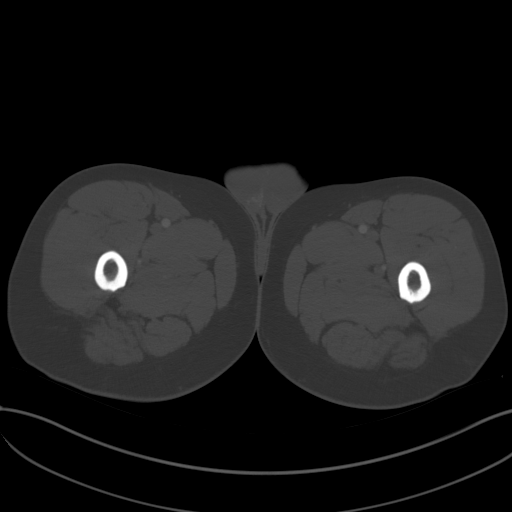
[im 18/109  soft-tissue]
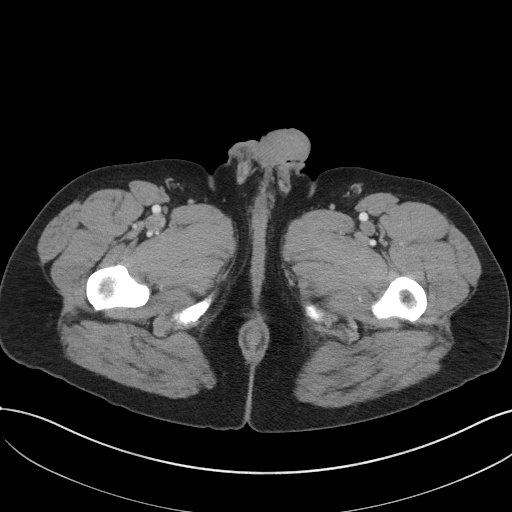
[im 23/109  soft-tissue]
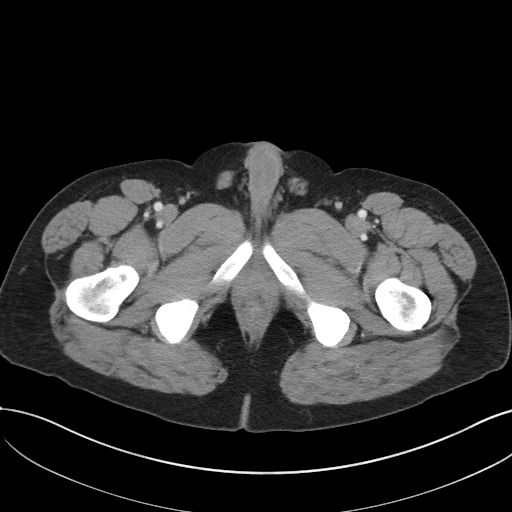
[im 29/109  soft-tissue]
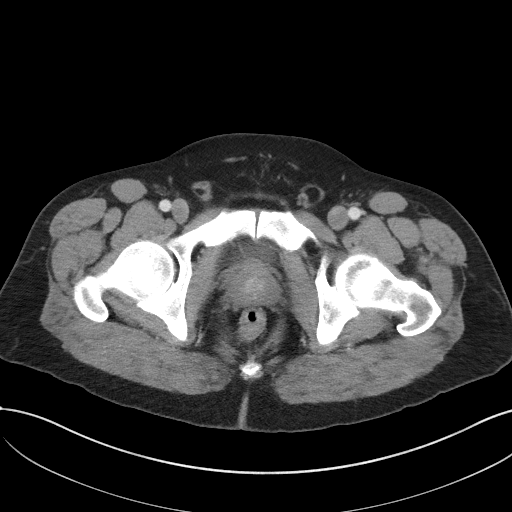
[im 40/109  soft-tissue]
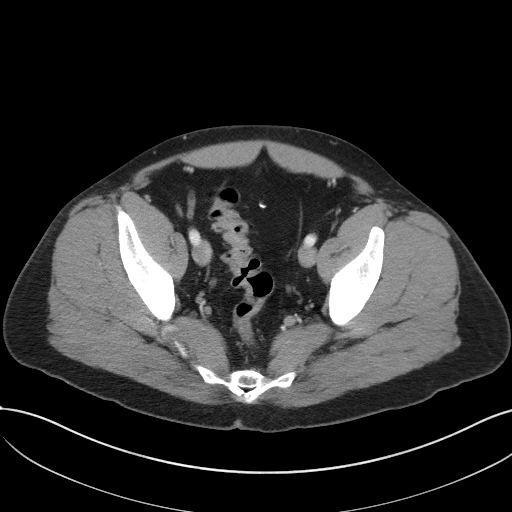
[im 46/109  soft-tissue]
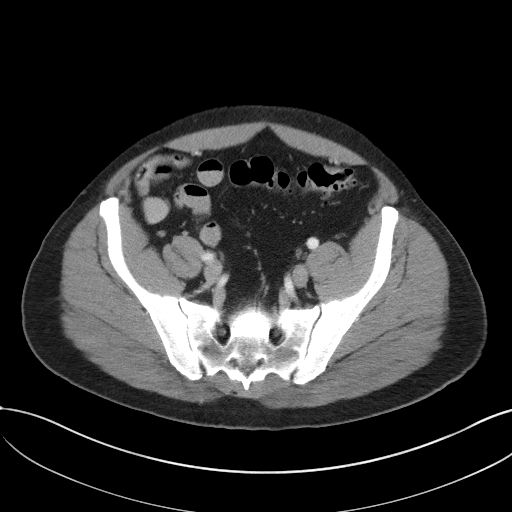
[im 57/109  soft-tissue]
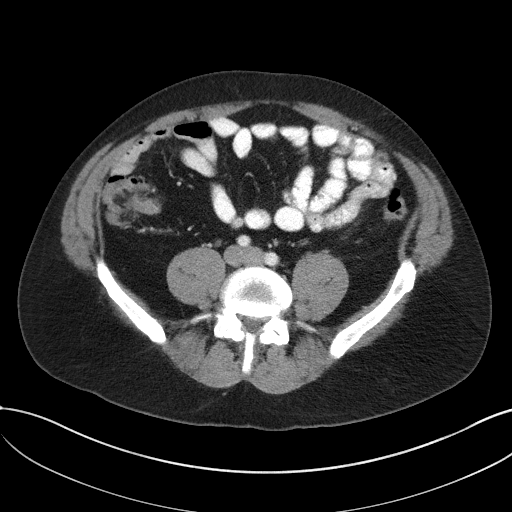
[im 63/109  soft-tissue]
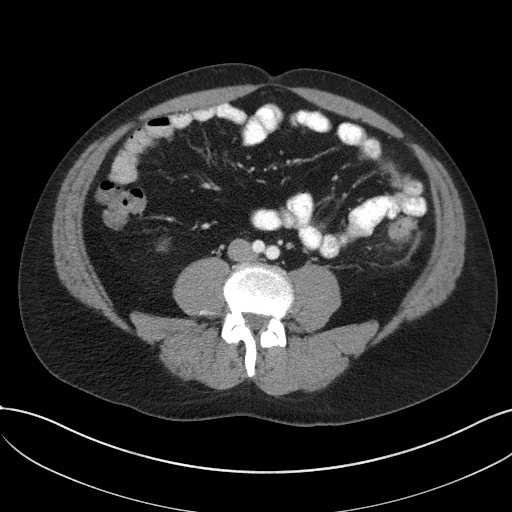
[im 69/109  soft-tissue]
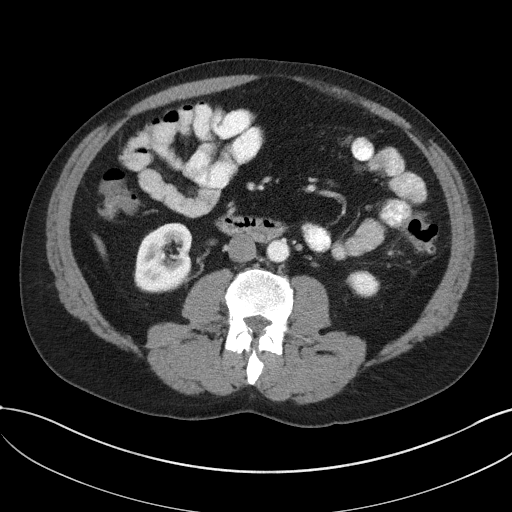
[im 69/109  bone]
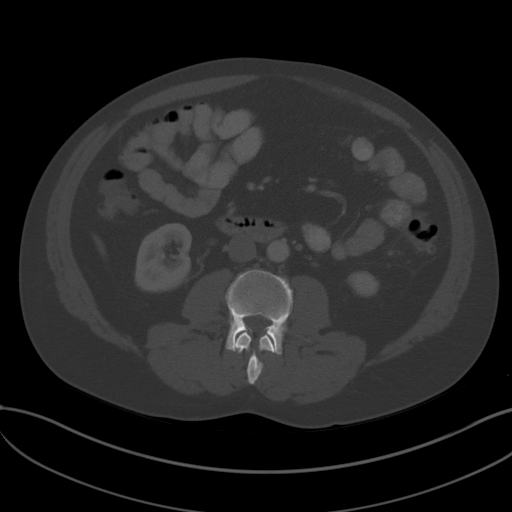
[im 80/109  soft-tissue]
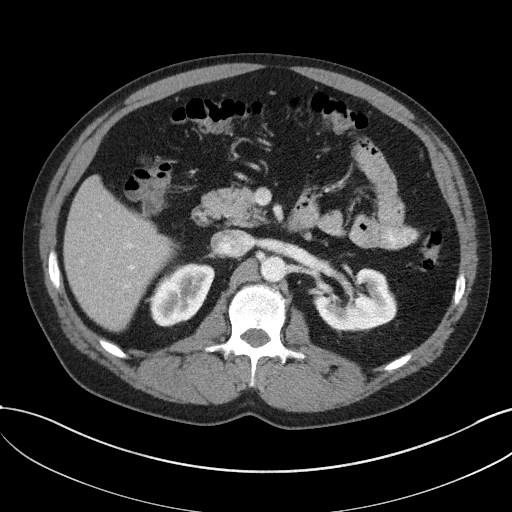
[im 86/109  soft-tissue]
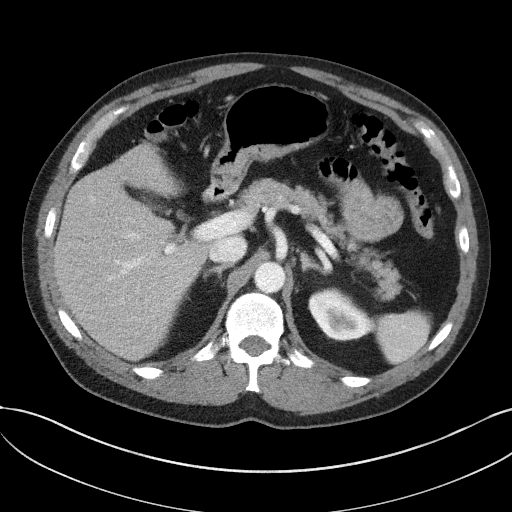
[im 91/109  soft-tissue]
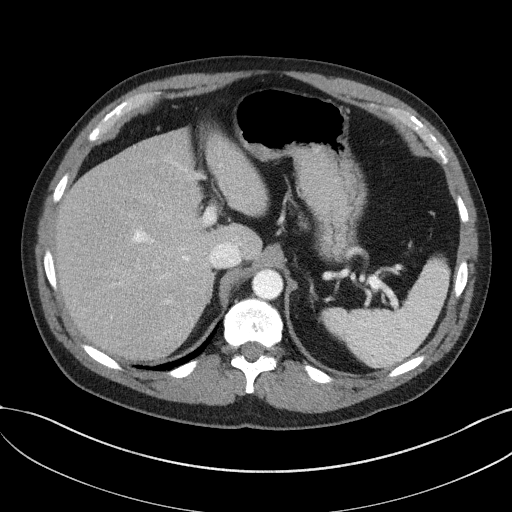
[im 103/109  soft-tissue]
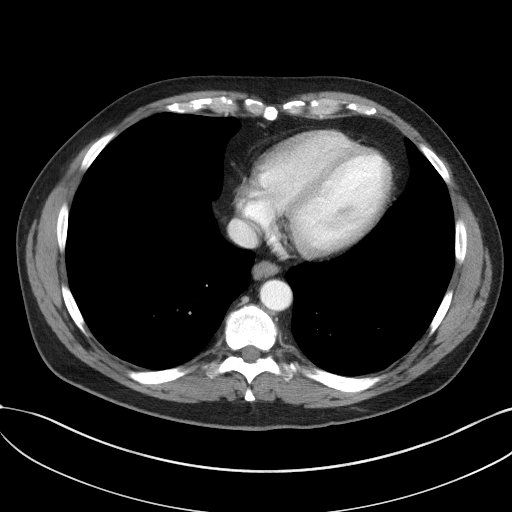

[Series 5: coronal st · coronal · 0.81mm/px · 3 of 100 slices shown]
[im 34/100  soft-tissue]
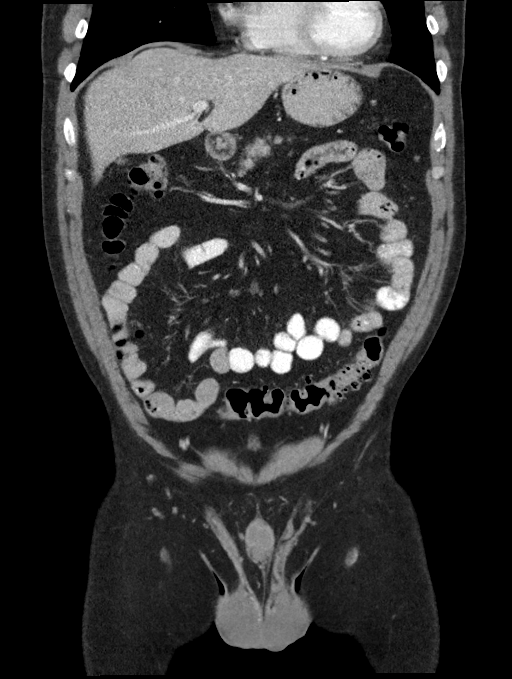
[im 45/100  soft-tissue]
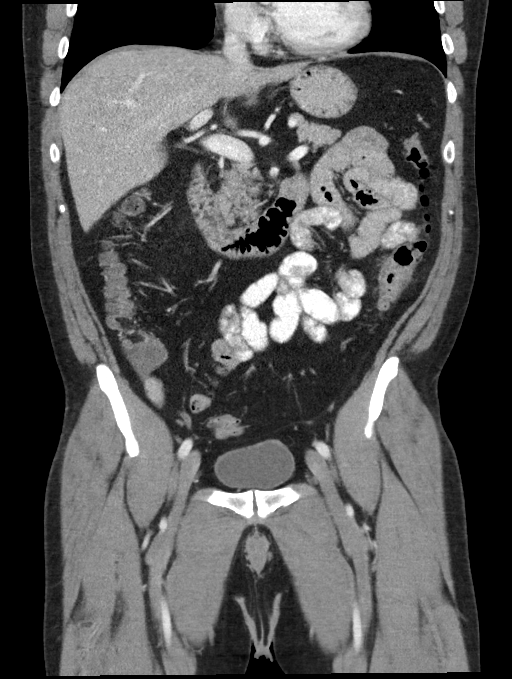
[im 56/100  soft-tissue]
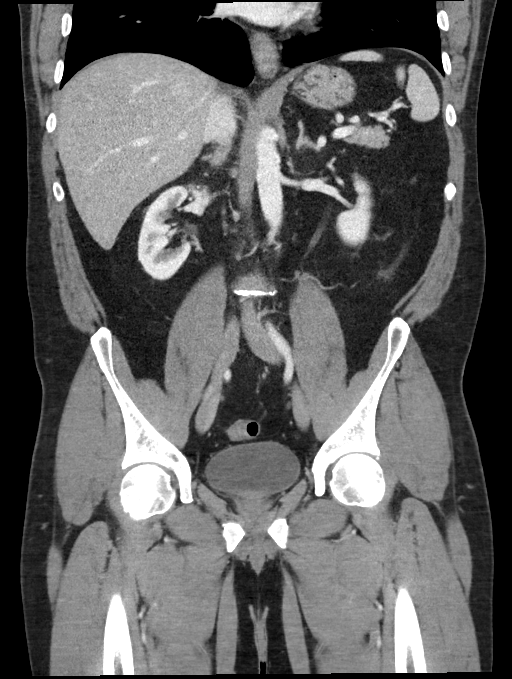

[16 of 46 positions shown; findings below may reference images not displayed]

FINDINGS: Lower Chest: No acute findings.

Hepatobiliary: No hepatic masses identified. Gallbladder is
unremarkable. No evidence of biliary ductal dilatation.

Pancreas:  No mass or inflammatory changes.

Spleen: Within normal limits in size and appearance.

Adrenals/Urinary Tract: No masses identified. No evidence of
ureteral calculi or hydronephrosis.

Stomach/Bowel: Mild diverticulitis is seen involving the descending
colon. No evidence of perforation or abscess. No evidence of bowel
obstruction. Normal appendix visualized.

Vascular/Lymphatic: No pathologically enlarged lymph nodes. No acute
vascular findings.

Reproductive:  No mass or other significant abnormality.

Other:  None.

Musculoskeletal:  No suspicious bone lesions identified.
IMPRESSION: Mild diverticulitis involving the descending colon. No evidence of
perforation or abscess.
